# Patient Record
Sex: Male | Born: 1960 | Race: Black or African American | Hispanic: No | Marital: Married | Smoking: Current every day smoker
Health system: Southern US, Community
[De-identification: ages and names within clinical notes are randomized; demographics above are authoritative.]

## PROBLEM LIST (undated history)

## (undated) DIAGNOSIS — I10 Essential (primary) hypertension: Secondary | ICD-10-CM

## (undated) HISTORY — PX: TOTAL HIP ARTHROPLASTY: SHX124

---

## 1999-12-08 ENCOUNTER — Emergency Department (HOSPITAL_COMMUNITY): Admission: EM | Admit: 1999-12-08 | Discharge: 1999-12-08 | Payer: Self-pay | Admitting: Internal Medicine

## 2009-05-11 ENCOUNTER — Inpatient Hospital Stay (HOSPITAL_COMMUNITY): Admission: RE | Admit: 2009-05-11 | Discharge: 2009-05-14 | Payer: Self-pay | Admitting: Orthopaedic Surgery

## 2010-02-25 ENCOUNTER — Emergency Department (HOSPITAL_COMMUNITY): Admission: EM | Admit: 2010-02-25 | Discharge: 2010-02-25 | Payer: Self-pay | Admitting: Emergency Medicine

## 2010-08-26 LAB — PROTIME-INR
INR: 1.06 (ref 0.00–1.49)
INR: 1.6 — ABNORMAL HIGH (ref 0.00–1.49)
INR: 1.83 — ABNORMAL HIGH (ref 0.00–1.49)
Prothrombin Time: 13.7 seconds (ref 11.6–15.2)
Prothrombin Time: 14.4 seconds (ref 11.6–15.2)
Prothrombin Time: 18.9 seconds — ABNORMAL HIGH (ref 11.6–15.2)
Prothrombin Time: 21 seconds — ABNORMAL HIGH (ref 11.6–15.2)

## 2010-08-26 LAB — BASIC METABOLIC PANEL
BUN: 6 mg/dL (ref 6–23)
Calcium: 8.8 mg/dL (ref 8.4–10.5)
Calcium: 8.9 mg/dL (ref 8.4–10.5)
Chloride: 93 mEq/L — ABNORMAL LOW (ref 96–112)
Creatinine, Ser: 0.76 mg/dL (ref 0.4–1.5)
Creatinine, Ser: 0.81 mg/dL (ref 0.4–1.5)
GFR calc Af Amer: >60 mL/min (ref >60)
GFR calc Af Amer: >60 mL/min (ref >60)
GFR calc non Af Amer: >60 mL/min (ref >60)
GFR calc non Af Amer: >60 mL/min (ref >60)
GFR calc non Af Amer: >60 mL/min (ref >60)
Glucose, Bld: 121 mg/dL — ABNORMAL HIGH (ref 70–99)
Sodium: 130 mEq/L — ABNORMAL LOW (ref 135–145)

## 2010-08-26 LAB — CBC
Hemoglobin: 11.8 g/dL — ABNORMAL LOW (ref 13.0–17.0)
MCV: 90.5 fL (ref 78.0–100.0)
Platelets: 260 10*3/uL (ref 150–400)
Platelets: 297 10*3/uL (ref 150–400)
RBC: 3.63 MIL/uL — ABNORMAL LOW (ref 4.22–5.81)
RDW: 13.2 % (ref 11.5–15.5)
WBC: 10.5 10*3/uL (ref 4.0–10.5)
WBC: 14.3 10*3/uL — ABNORMAL HIGH (ref 4.0–10.5)

## 2010-08-26 LAB — TYPE AND SCREEN
ABO/RH(D): O POS
Antibody Screen: NEGATIVE

## 2010-08-27 LAB — BASIC METABOLIC PANEL
Calcium: 9.7 mg/dL (ref 8.4–10.5)
GFR calc Af Amer: >60 mL/min (ref >60)
GFR calc non Af Amer: >60 mL/min (ref >60)
Potassium: 4.4 mEq/L (ref 3.5–5.1)
Sodium: 139 mEq/L (ref 135–145)

## 2011-06-28 IMAGING — CR DG HIP (WITH OR WITHOUT PELVIS) 2-3V*L*
3 series · 3 of 3 positions shown · non-contrast
Comparison: 05/11/2009

CLINICAL DATA: Fell - left hip pain

LEFT HIP - COMPLETE 2+ VIEW

[t pelvis a.p.]
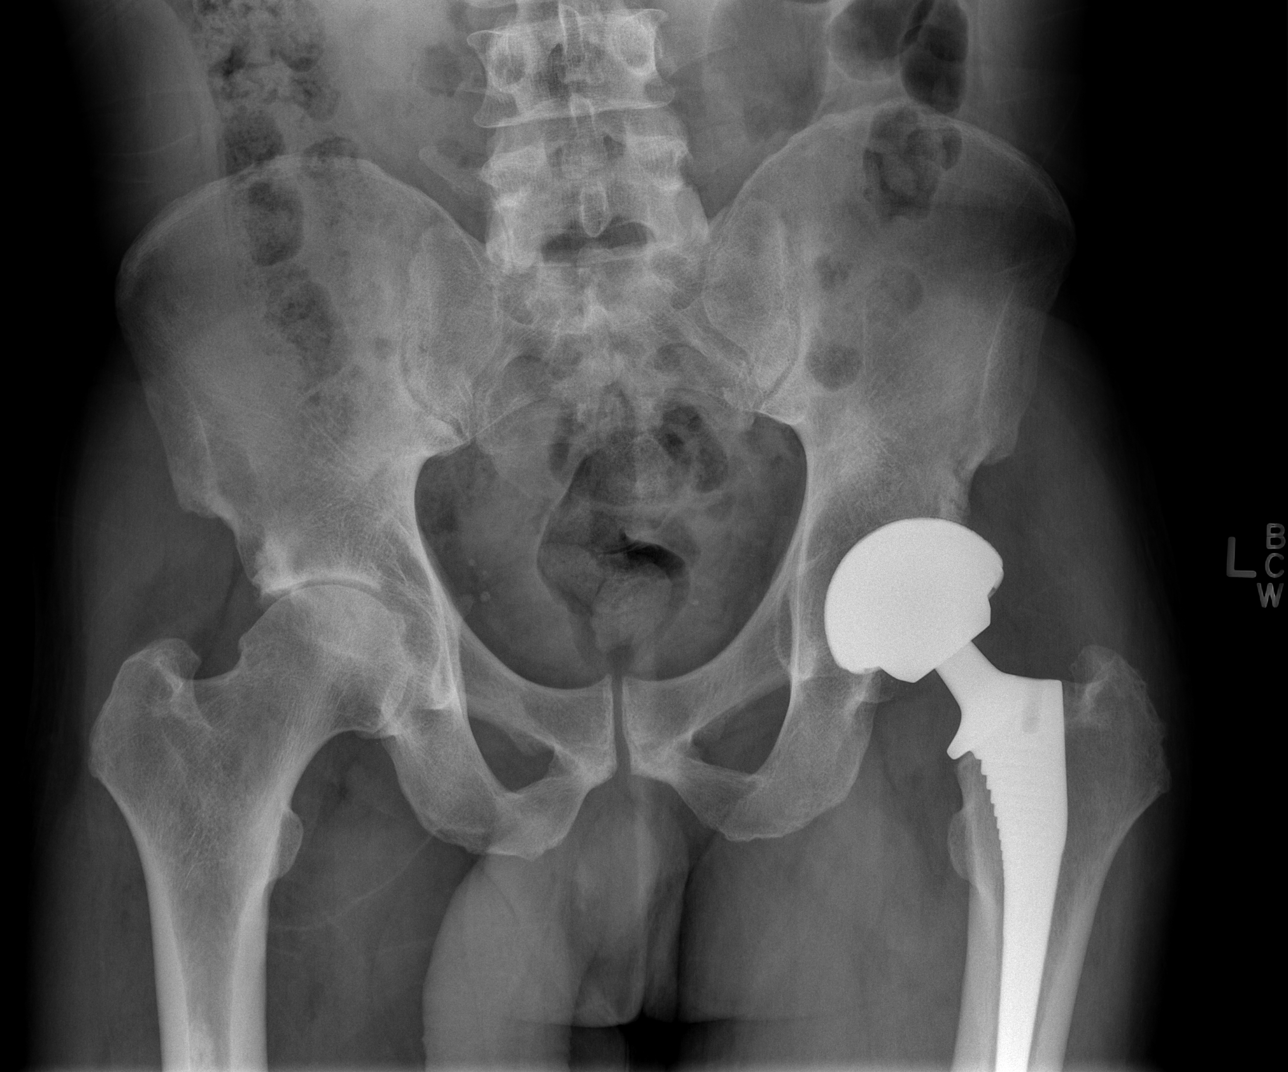

[t hip ap left]
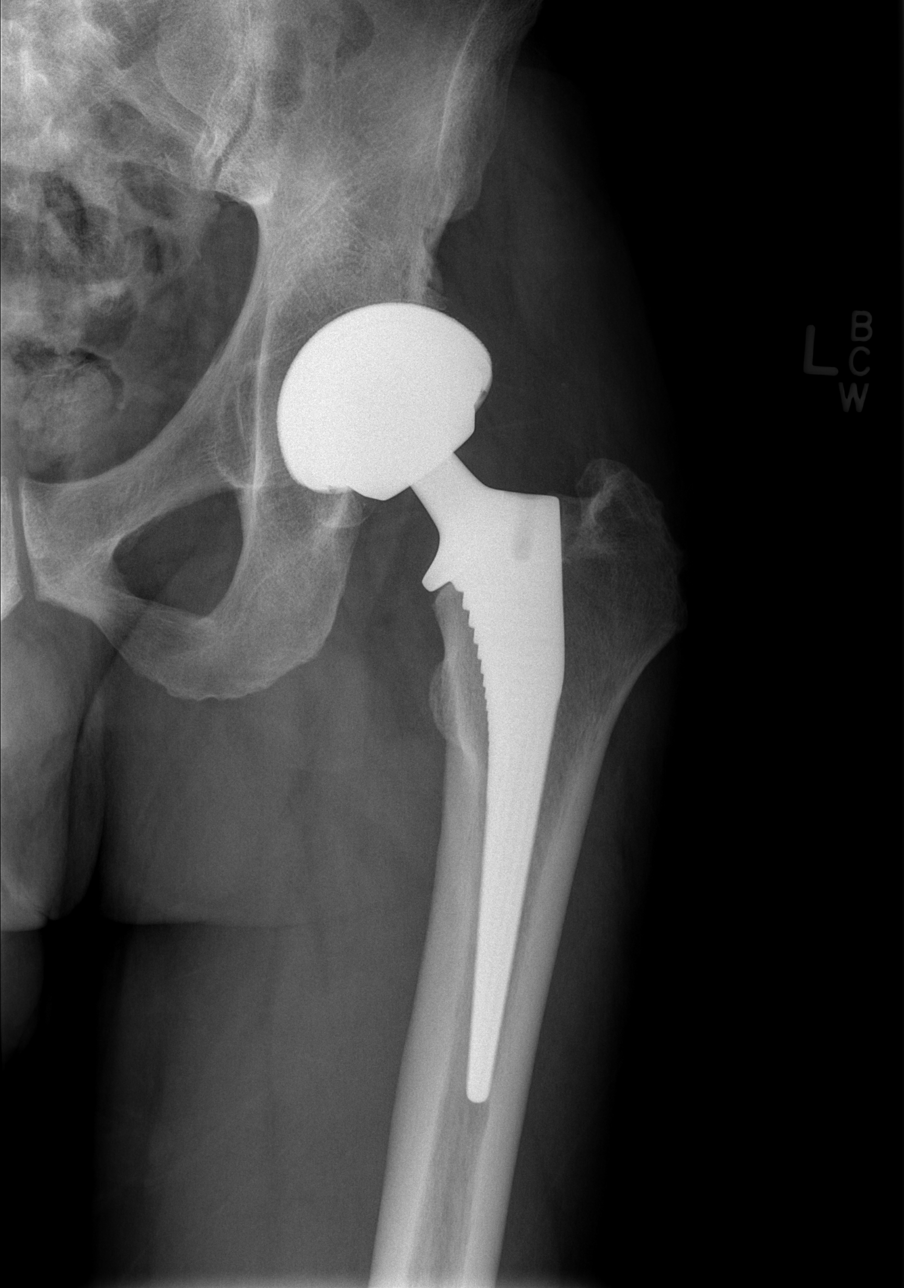

[t hip frog leg left]
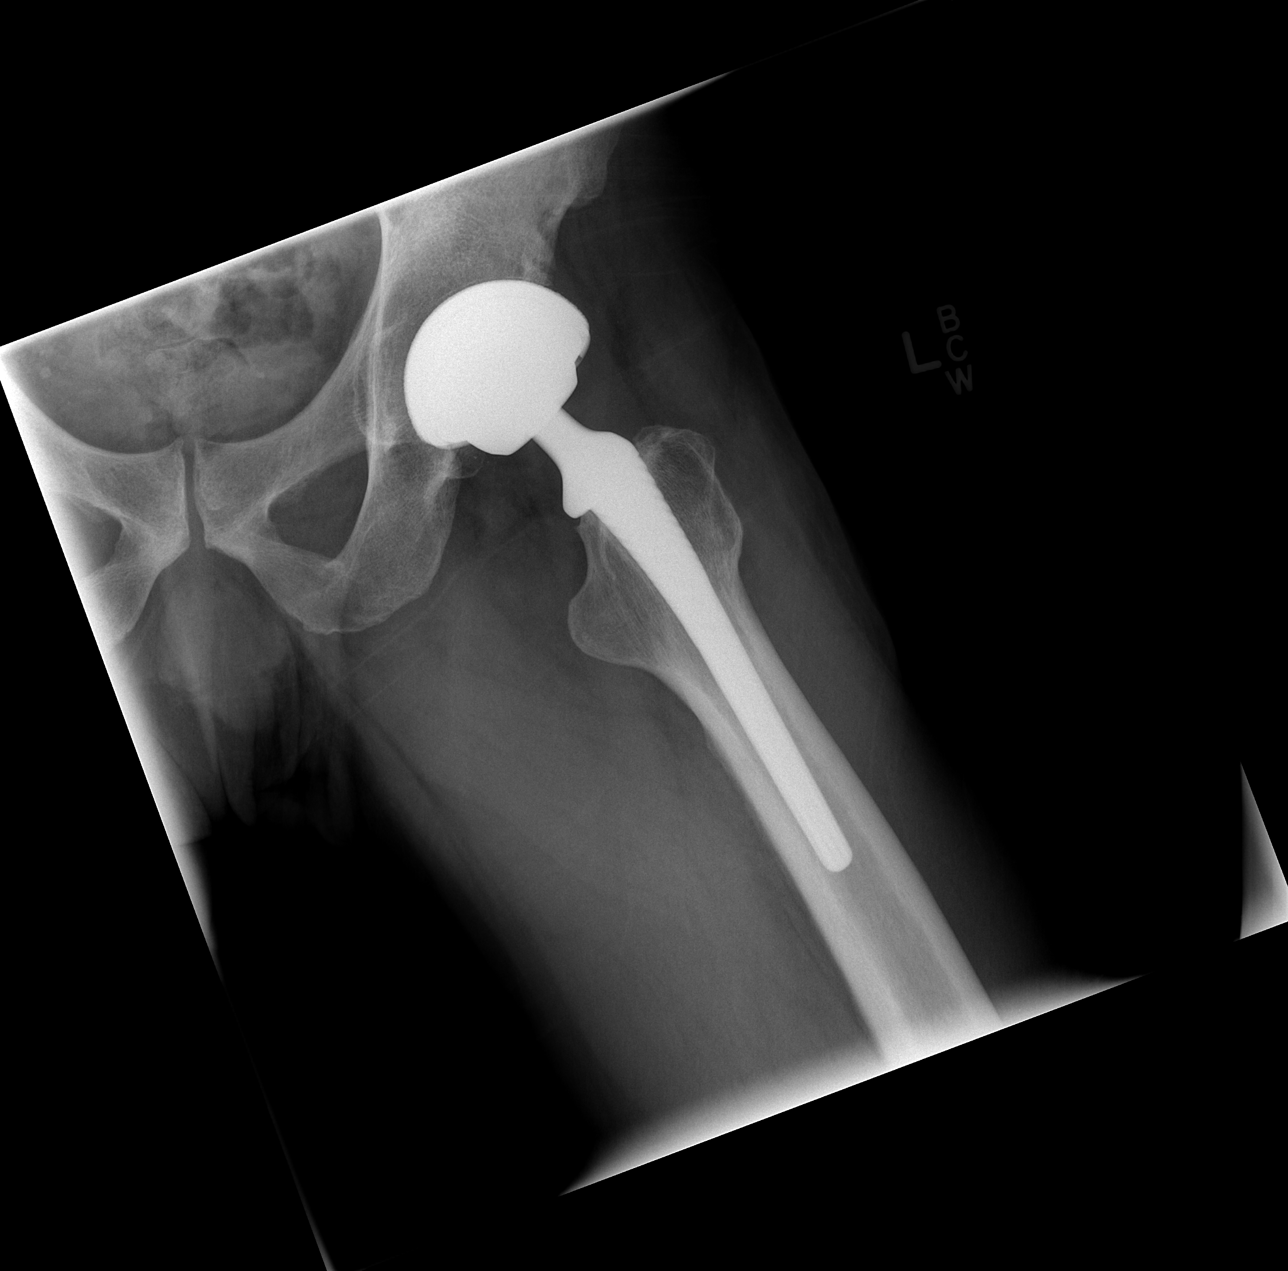

[3 of 3 positions shown; findings below may reference images not displayed]

FINDINGS: Post left hip arthroplasty changes again noted.  No
fracture, dislocation, or other complications.  There are stable
degenerative changes of the right hip.
IMPRESSION: No acute findings.

## 2016-09-01 ENCOUNTER — Encounter (HOSPITAL_COMMUNITY): Payer: Self-pay

## 2016-09-01 ENCOUNTER — Emergency Department (HOSPITAL_COMMUNITY)
Admission: EM | Admit: 2016-09-01 | Discharge: 2016-09-01 | Disposition: A | Discharge status: Home / Self Care | Payer: Medicare Other | Attending: Emergency Medicine | Admitting: Emergency Medicine

## 2016-09-01 ENCOUNTER — Emergency Department (HOSPITAL_COMMUNITY)
Admission: EM | Admit: 2016-09-01 | Discharge: 2016-09-01 | Disposition: A | Discharge status: Home / Self Care | Payer: Medicaid Other | Attending: Dermatology | Admitting: Dermatology

## 2016-09-01 DIAGNOSIS — F17200 Nicotine dependence, unspecified, uncomplicated: Secondary | ICD-10-CM | POA: Insufficient documentation

## 2016-09-01 DIAGNOSIS — Z5321 Procedure and treatment not carried out due to patient leaving prior to being seen by health care provider: Secondary | ICD-10-CM | POA: Insufficient documentation

## 2016-09-01 DIAGNOSIS — I159 Secondary hypertension, unspecified: Secondary | ICD-10-CM | POA: Insufficient documentation

## 2016-09-01 DIAGNOSIS — I10 Essential (primary) hypertension: Secondary | ICD-10-CM | POA: Insufficient documentation

## 2016-09-01 LAB — CBC WITH DIFFERENTIAL/PLATELET
Basophils Absolute: 0 10*3/uL (ref 0.0–0.1)
Basophils Relative: 0 %
Eosinophils Absolute: 0.4 10*3/uL (ref 0.0–0.7)
Eosinophils Relative: 4 %
HEMATOCRIT: 36.9 % — AB (ref 39.0–52.0)
HEMOGLOBIN: 12.4 g/dL — AB (ref 13.0–17.0)
LYMPHS ABS: 2.1 10*3/uL (ref 0.7–4.0)
LYMPHS PCT: 23 %
MCH: 30 pg (ref 26.0–34.0)
MCHC: 33.6 g/dL (ref 30.0–36.0)
MCV: 89.1 fL (ref 78.0–100.0)
MONO ABS: 1 10*3/uL (ref 0.1–1.0)
MONOS PCT: 11 %
NEUTROS ABS: 5.6 10*3/uL (ref 1.7–7.7)
NEUTROS PCT: 62 %
Platelets: 297 10*3/uL (ref 150–400)
RBC: 4.14 MIL/uL — ABNORMAL LOW (ref 4.22–5.81)
RDW: 13.7 % (ref 11.5–15.5)
WBC: 9.1 10*3/uL (ref 4.0–10.5)

## 2016-09-01 LAB — BASIC METABOLIC PANEL
Anion gap: 11 (ref 5–15)
BUN: 10 mg/dL (ref 6–20)
CHLORIDE: 98 mmol/L — AB (ref 101–111)
CO2: 26 mmol/L (ref 22–32)
CREATININE: 1.07 mg/dL (ref 0.61–1.24)
Calcium: 9.3 mg/dL (ref 8.9–10.3)
GFR calc non Af Amer: >60 mL/min (ref >60)
GLUCOSE: 89 mg/dL (ref 65–99)
Potassium: 3.7 mmol/L (ref 3.5–5.1)
Sodium: 135 mmol/L (ref 135–145)

## 2016-09-01 LAB — TSH: TSH: 2.305 u[IU]/mL (ref 0.350–4.500)

## 2016-09-01 MED ORDER — HYDROCHLOROTHIAZIDE 25 MG PO TABS
25.0000 mg | ORAL_TABLET | Freq: Every day | ORAL | Status: DC
  Administered 2016-09-01: 25 mg via ORAL
  Filled 2016-09-01: qty 1

## 2016-09-01 MED ORDER — HYDROCHLOROTHIAZIDE 25 MG PO TABS: 25.0000 mg | ORAL_TABLET | Freq: Every day | ORAL | 1 refills | Status: DC

## 2016-09-01 NOTE — ED Triage Notes (Signed)
Pt was here for same earlier but had to leave to pick up son from school. Pt was at [facility] and had bp checked and was instructed to come here. PT bp was 210/110. Pt denies headache or other symptoms. He reports he is supposed to be seeing pcp to get back on bp meds

## 2016-09-01 NOTE — ED Notes (Signed)
Pt states he needs to leave and go pick up his son. RN advised against leaving and pt understands the risks of leaving. Pt still wishes to leave and come back. Pt takes full responsibility.

## 2016-09-01 NOTE — ED Provider Notes (Signed)
[facility] DEPT Provider Note   CSN: [id number] Arrival date & time: 09/01/16  1529     History   Chief Complaint Chief Complaint  Patient presents with  . Hypertension    HPI Steven Lucas is a 56 y.o. male.  Last med refill was for HCTZ a year ago. None since then. BP high since then. No chest pain, headache, sob, leg swelling, decreased UOP, vision changes or other associated symptoms.    Hypertension  This is a chronic problem. The current episode started more than 1 week ago. The problem occurs constantly. The problem has not changed since onset.Pertinent negatives include no chest pain, no abdominal pain, no headaches and no shortness of breath. Nothing aggravates the symptoms. Nothing relieves the symptoms. He has tried nothing for the symptoms. The treatment provided no relief.    History reviewed. No pertinent past medical history.  There are no active problems to display for this patient.   History reviewed. No pertinent surgical history.     Home Medications    Prior to Admission medications   Medication Sig Start Date End Date Taking? Authorizing Provider  hydrochlorothiazide (HYDRODIURIL) 25 MG tablet Take 1 tablet (25 mg total) by mouth daily. 09/01/16   Marily Memos, MD    Family History No family history on file.  Social History Social History  Substance Use Topics  . Smoking status: Current Every Day Smoker  . Smokeless tobacco: Never Used  . Alcohol use No     Allergies   Patient has no known allergies.   Review of Systems Review of Systems  Respiratory: Negative for shortness of breath.   Cardiovascular: Negative for chest pain.  Gastrointestinal: Negative for abdominal pain.  Neurological: Negative for headaches.  All other systems reviewed and are negative.    Physical Exam Updated Vital Signs BP (!) 175/104   Pulse 94   Temp 98.5 F (36.9 C) (Oral)   Resp 19   Ht  (1.676 m)   Wt 162 lb (73.5 kg)   SpO2 96%   BMI  26.15 kg/m   Physical Exam  Constitutional: He is oriented to person, place, and time. He appears well-developed and well-nourished.  HENT:  Head: Normocephalic and atraumatic.  Eyes: Conjunctivae and EOM are normal.  exopthalmos  Neck: Normal range of motion.  Cardiovascular: Tachycardia present.   Pulmonary/Chest: Effort normal. No respiratory distress.  Abdominal: Soft. He exhibits no distension.  Musculoskeletal: Normal range of motion.  Neurological: He is alert and oriented to person, place, and time. No cranial nerve deficit. Coordination normal.  Skin: Skin is warm and dry.  Nursing note and vitals reviewed.    ED Treatments / Results  Labs (all labs ordered are listed, but only abnormal results are displayed) Labs Reviewed  CBC WITH DIFFERENTIAL/PLATELET - Abnormal; Notable for the following:       Result Value   RBC 4.14 (*)    Hemoglobin 12.4 (*)    HCT 36.9 (*)    All other components within normal limits  BASIC METABOLIC PANEL - Abnormal; Notable for the following:    Chloride 98 (*)    All other components within normal limits  TSH    EKG  EKG Interpretation None       Radiology No results found.  Procedures Procedures (including critical care time)  Medications Ordered in ED Medications  hydrochlorothiazide (HYDRODIURIL) tablet 25 mg (25 mg Oral Given 09/01/16 2130)     Initial Impression / Assessment and  Plan / ED Course  I have reviewed the triage vital signs and the nursing notes.  Pertinent labs & imaging results that were available during my care of the patient were reviewed by me and considered in my medical decision making (see chart for details).    Tachycardia likely 2/2 caffeine intake and general increased energy but will check thyroid and basic labs. If ok, restart HCTZ, pcp follow up for same. Workup unremarkable, patient is stable for discharge and follow-up with his primary doctor on Thursday as previously scheduled.  Final  Clinical Impressions(s) / ED Diagnoses   Final diagnoses:  Secondary hypertension    New Prescriptions Discharge Medication List as of 09/01/2016 11:15 PM    START taking these medications   Details  hydrochlorothiazide (HYDRODIURIL) 25 MG tablet Take 1 tablet (25 mg total) by mouth daily., Starting Mon 09/01/2016, Print         Marily Memos, MD 09/01/16 [phone]

## 2016-11-20 ENCOUNTER — Emergency Department (HOSPITAL_COMMUNITY): Payer: Medicare Other

## 2016-11-20 ENCOUNTER — Encounter (HOSPITAL_COMMUNITY): Payer: Self-pay

## 2016-11-20 ENCOUNTER — Observation Stay (HOSPITAL_COMMUNITY)
Admission: EM | Admit: 2016-11-20 | Discharge: 2016-11-21 | Disposition: A | Discharge status: Home / Self Care | Payer: Medicare Other | Attending: Internal Medicine | Admitting: Internal Medicine

## 2016-11-20 DIAGNOSIS — Z79899 Other long term (current) drug therapy: Secondary | ICD-10-CM | POA: Diagnosis not present

## 2016-11-20 DIAGNOSIS — F1410 Cocaine abuse, uncomplicated: Secondary | ICD-10-CM | POA: Diagnosis present

## 2016-11-20 DIAGNOSIS — F17200 Nicotine dependence, unspecified, uncomplicated: Secondary | ICD-10-CM | POA: Diagnosis not present

## 2016-11-20 DIAGNOSIS — I119 Hypertensive heart disease without heart failure: Secondary | ICD-10-CM | POA: Insufficient documentation

## 2016-11-20 DIAGNOSIS — R079 Chest pain, unspecified: Principal | ICD-10-CM | POA: Diagnosis present

## 2016-11-20 DIAGNOSIS — Z9114 Patient's other noncompliance with medication regimen: Secondary | ICD-10-CM | POA: Insufficient documentation

## 2016-11-20 DIAGNOSIS — I10 Essential (primary) hypertension: Secondary | ICD-10-CM | POA: Diagnosis present

## 2016-11-20 DIAGNOSIS — F1010 Alcohol abuse, uncomplicated: Secondary | ICD-10-CM | POA: Diagnosis not present

## 2016-11-20 HISTORY — DX: Essential (primary) hypertension: I10

## 2016-11-20 LAB — CBC
HCT: 39.8 % (ref 39.0–52.0)
HEMOGLOBIN: 13.5 g/dL (ref 13.0–17.0)
MCH: 30.8 pg (ref 26.0–34.0)
MCHC: 33.9 g/dL (ref 30.0–36.0)
MCV: 90.7 fL (ref 78.0–100.0)
PLATELETS: 290 10*3/uL (ref 150–400)
RBC: 4.39 MIL/uL (ref 4.22–5.81)
RDW: 13.7 % (ref 11.5–15.5)
WBC: 10.2 10*3/uL (ref 4.0–10.5)

## 2016-11-20 LAB — COMPREHENSIVE METABOLIC PANEL
ALT: 19 U/L (ref 17–63)
AST: 30 U/L (ref 15–41)
Albumin: 3.9 g/dL (ref 3.5–5.0)
Alkaline Phosphatase: 78 U/L (ref 38–126)
Anion gap: 8 (ref 5–15)
BILIRUBIN TOTAL: 0.7 mg/dL (ref 0.3–1.2)
BUN: 11 mg/dL (ref 6–20)
CO2: 26 mmol/L (ref 22–32)
Calcium: 9.2 mg/dL (ref 8.9–10.3)
Chloride: 103 mmol/L (ref 101–111)
Creatinine, Ser: 1.1 mg/dL (ref 0.61–1.24)
Glucose, Bld: 158 mg/dL — ABNORMAL HIGH (ref 65–99)
POTASSIUM: 3.9 mmol/L (ref 3.5–5.1)
Sodium: 137 mmol/L (ref 135–145)
TOTAL PROTEIN: 7 g/dL (ref 6.5–8.1)

## 2016-11-20 LAB — I-STAT TROPONIN, ED
TROPONIN I, POC: 0.02 ng/mL (ref 0.00–0.08)
TROPONIN I, POC: 0.02 ng/mL (ref 0.00–0.08)
TROPONIN I, POC: 0.01 ng/mL (ref 0.00–0.08)

## 2016-11-20 LAB — RAPID URINE DRUG SCREEN, HOSP PERFORMED
Amphetamines: NOT DETECTED
BENZODIAZEPINES: NOT DETECTED
Barbiturates: NOT DETECTED
Cocaine: POSITIVE — AB
Opiates: NOT DETECTED
Tetrahydrocannabinol: NOT DETECTED

## 2016-11-20 LAB — ETHANOL

## 2016-11-20 LAB — MAGNESIUM: MAGNESIUM: 2.2 mg/dL (ref 1.7–2.4)

## 2016-11-20 MED ORDER — THIAMINE HCL 100 MG/ML IJ SOLN: 100.0000 mg | Freq: Every day | INTRAMUSCULAR | Status: DC

## 2016-11-20 MED ORDER — GUAIFENESIN-DM 100-10 MG/5ML PO SYRP: 5.0000 mL | ORAL_SOLUTION | ORAL | Status: DC | PRN

## 2016-11-20 MED ORDER — LORAZEPAM 2 MG/ML IJ SOLN
1.0000 mg | Freq: Once | INTRAMUSCULAR | Status: AC
  Administered 2016-11-20: 1 mg via INTRAVENOUS
  Filled 2016-11-20: qty 1

## 2016-11-20 MED ORDER — LORAZEPAM 1 MG PO TABS: 0.0000 mg | ORAL_TABLET | Freq: Two times a day (BID) | ORAL | Status: DC

## 2016-11-20 MED ORDER — VITAMIN B-1 100 MG PO TABS
100.0000 mg | ORAL_TABLET | Freq: Every day | ORAL | Status: DC
  Administered 2016-11-20 – 2016-11-21 (×2): 100 mg via ORAL
  Filled 2016-11-20 (×2): qty 1

## 2016-11-20 MED ORDER — ACETAMINOPHEN 325 MG PO TABS
650.0000 mg | ORAL_TABLET | ORAL | Status: DC | PRN
  Administered 2016-11-21: 650 mg via ORAL
  Filled 2016-11-20: qty 2

## 2016-11-20 MED ORDER — GUAIFENESIN-DM 100-10 MG/5ML PO SYRP
5.0000 mL | ORAL_SOLUTION | Freq: Once | ORAL | Status: AC
  Administered 2016-11-20: 5 mL via ORAL
  Filled 2016-11-20: qty 5

## 2016-11-20 MED ORDER — GUAIFENESIN-DM 100-10 MG/5ML PO SYRP: 5.0000 mL | ORAL_SOLUTION | Freq: Once | ORAL | Status: DC

## 2016-11-20 MED ORDER — ONDANSETRON HCL 4 MG/2ML IJ SOLN: 4.0000 mg | Freq: Four times a day (QID) | INTRAMUSCULAR | Status: DC | PRN

## 2016-11-20 MED ORDER — LORAZEPAM 1 MG PO TABS: 0.0000 mg | ORAL_TABLET | Freq: Four times a day (QID) | ORAL | Status: DC

## 2016-11-20 MED ORDER — LORAZEPAM 2 MG/ML IJ SOLN
0.0000 mg | Freq: Four times a day (QID) | INTRAMUSCULAR | Status: DC
Start: 2016-11-20 — End: 2016-11-21

## 2016-11-20 MED ORDER — LORAZEPAM 2 MG/ML IJ SOLN
0.0000 mg | Freq: Two times a day (BID) | INTRAMUSCULAR | Status: DC
Start: 2016-11-23 — End: 2016-11-21

## 2016-11-20 MED ORDER — ENOXAPARIN SODIUM 40 MG/0.4ML ~~LOC~~ SOLN: 40.0000 mg | SUBCUTANEOUS | Status: DC

## 2016-11-20 NOTE — H&P (Signed)
History and Physical    Steven Lucas ZOX:[id number] DOB: 05/03/1961 DOA: 11/20/2016  PCP: Patient, No Pcp Per  Patient coming from: Home  I have personally briefly reviewed patient[facility]  Chief Complaint: EtOH abuse  HPI: Steven MerlinJoey M Forrester is a 56 y.o. male with medical history significant of HTN.  He presents to ED with request for detox.  He uses EtOH and crack cocaine daily, last use last night.  Last night he developed sharp chest pain in center of chest, no radiation.  Associated with heavy breathing.  Was unremitting and constant since onset until he was given ativan in the ED.   ED Course: After being given just 1mg  ativan he is now pain free, and resting very comfortably.  Trop is 0.02 x2 thus far.  EKG with non-specific abnormality.  EDP is concerned patient may need inpatient rule out for CP.  Review of Systems: As per HPI otherwise 10 point review of systems negative.   Past Medical History:  Diagnosis Date  . Hypertension     Past Surgical History:  Procedure Laterality Date  . TOTAL HIP ARTHROPLASTY       reports that he has been smoking.  He has never used smokeless tobacco. He reports that he drinks alcohol. He reports that he uses drugs, including Cocaine.  No Known Allergies  No family history on file.   Prior to Admission medications   Medication Sig Start Date End Date Taking? Authorizing Provider  hydrochlorothiazide (HYDRODIURIL) 25 MG tablet Take 1 tablet (25 mg total) by mouth daily. 09/01/16  Yes Mesner, Barbara CowerJason, MD    Physical Exam: Vitals:   11/20/16 1645 11/20/16 1715 11/20/16 1800 11/20/16 1845  BP: (!) 177/97     Pulse: 92 95 91 90  Resp: 16 19 19 18   Temp:      TempSrc:      SpO2: 97% 100% 96% 95%  Weight:      Height:        Constitutional: NAD, calm, comfortable, sedated Eyes: PERRL, lids and conjunctivae normal ENMT: Mucous membranes are moist. Posterior pharynx clear of any exudate or  lesions.Normal dentition.  Neck: normal, supple, no masses, no thyromegaly Respiratory: clear to auscultation bilaterally, no wheezing, no crackles. Normal respiratory effort. No accessory muscle use.  Cardiovascular: Regular rate and rhythm, no murmurs / rubs / gallops. No extremity edema. 2+ pedal pulses. No carotid bruits.  Abdomen: no tenderness, no masses palpated. No hepatosplenomegaly. Bowel sounds positive.  Musculoskeletal: no clubbing / cyanosis. No joint deformity upper and lower extremities. Good ROM, no contractures. Normal muscle tone.  Skin: no rashes, lesions, ulcers. No induration Neurologic: CN 2-12 grossly intact. Sensation intact, DTR normal. Strength 5/5 in all 4.  Psychiatric: Normal judgment and insight. Alert and oriented x 3. Normal mood.    Labs on Admission: I have personally reviewed following labs and imaging studies  CBC:  Recent Labs Lab 11/20/16 1415  WBC 10.2  HGB 13.5  HCT 39.8  MCV 90.7  PLT 290   Basic Metabolic Panel:  Recent Labs Lab 11/20/16 1415  NA 137  K 3.9  CL 103  CO2 26  GLUCOSE 158*  BUN 11  CREATININE 1.10  CALCIUM 9.2  MG 2.2   GFR: Estimated Creatinine Clearance: 66.9 mL/min (by C-G formula based on SCr of 1.1 mg/dL). Liver Function Tests:  Recent Labs Lab 11/20/16 1415  AST 30  ALT 19  ALKPHOS 78  BILITOT 0.7  PROT 7.0  ALBUMIN 3.9   No results for input(s): LIPASE, AMYLASE in the last 168 hours. No results for input(s): AMMONIA in the last 168 hours. Coagulation Profile: No results for input(s): INR, PROTIME in the last 168 hours. Cardiac Enzymes: No results for input(s): CKTOTAL, CKMB, CKMBINDEX, TROPONINI in the last 168 hours. BNP (last 3 results) No results for input(s): PROBNP in the last 8760 hours. HbA1C: No results for input(s): HGBA1C in the last 72 hours. CBG: No results for input(s): GLUCAP in the last 168 hours. Lipid Profile: No results for input(s): CHOL, HDL, LDLCALC, TRIG, CHOLHDL,  LDLDIRECT in the last 72 hours. Thyroid Function Tests: No results for input(s): TSH, T4TOTAL, FREET4, T3FREE, THYROIDAB in the last 72 hours. Anemia Panel: No results for input(s): VITAMINB12, FOLATE, FERRITIN, TIBC, IRON, RETICCTPCT in the last 72 hours. Urine analysis: No results found for: COLORURINE, APPEARANCEUR, LABSPEC, PHURINE, GLUCOSEU, HGBUR, BILIRUBINUR, KETONESUR, PROTEINUR, UROBILINOGEN, NITRITE, LEUKOCYTESUR  Radiological Exams on Admission: Dg Chest 2 View  Result Date: 11/20/2016 CLINICAL DATA:  Mid chest pain with vomiting today. The patient wishes to D toxic thigh for alcohol and crack cocaine which he use last night. History of hypertension but not currently on medication. Current smoker. EXAM: CHEST  2 VIEW COMPARISON:  PA and lateral chest x-ray of May 09, 2009 FINDINGS: The lungs are well-expanded and clear. The heart and pulmonary vascularity are normal. The mediastinum is normal in width. There is no pleural effusion. The bony thorax is unremarkable. IMPRESSION: There is no active cardiopulmonary disease. Electronically Signed   By: David  [place] M.D.   On: 11/20/2016 14:35    EKG: Independently reviewed.  Assessment/Plan Principal Problem:   Chest pain Active Problems:   ETOH abuse   Cocaine abuse    1. Chest pain - 1. 2 neg trops, has already been observed for 7 hours now in ED, has been nearly 24 hours since last cocaine use.  CP free at this time. 2. EDP calling cards to see if they think patient will need inpatient provocative testing or not. 3. If he does then we will admit and keep NPO after midnight for them to eval in AM for stress test. 4. If he doesn't then likely okay to be medically cleared for detox at this point. 2. EtOH abuse - 1. Very mild withdrawal requirements at this point, patient is sedated and cant remain awake for a conversation after just 1mg  ativan in ED. 2. Likely can be done as outpatient / detox facility. 3. Cocaine abuse -  needs to stop cocaine  DVT prophylaxis: Lovenox Code Status: Full Family Communication: No family in room Disposition Plan: Home after admit Consults called: Cards by EDP Admission status: Place in obs   Nilesh Stegall M. DO [facility] Pager [phone]  If 7AM-7PM, please contact day team taking care of patient [email or web address] Password [email or web address]  11/20/2016, 8:22 PM

## 2016-11-20 NOTE — ED Provider Notes (Signed)
[facility] DEPT Provider Note   CSN: [id number] Arrival date & time: 11/20/16  1327     History   Chief Complaint Chief Complaint  Patient presents with  . Alcohol Problem    HPI Steven Lucas is a 56 y.o. male.  HPI 56 year old male who presents with chest pain and request for detox. He has a history of alcohol dependence and chronic crack cocaine usage. States that he drinks and uses crack cocaine daily, with last drink and last cocaine usage yesterday evening. Begin to develop sharp chest pains in the center of his chest that was nonradiating associated with heavy breathing last night that has been unremitting. 2 episodes of nausea and vomiting this morning. Denies any tremor in or agitation. Denies any hallucinations. No abdominal pain, fevers or chills, syncope or near syncope. Normally drinks about 8 x 40s daily.    Past Medical History:  Diagnosis Date  . Hypertension     Patient Active Problem List   Diagnosis Date Noted  . Chest pain 11/20/2016  . ETOH abuse 11/20/2016  . Cocaine abuse 11/20/2016    Past Surgical History:  Procedure Laterality Date  . TOTAL HIP ARTHROPLASTY         Home Medications    Prior to Admission medications   Medication Sig Start Date End Date Taking? Authorizing Provider  hydrochlorothiazide (HYDRODIURIL) 25 MG tablet Take 1 tablet (25 mg total) by mouth daily. 09/01/16  Yes Mesner, Barbara Cower, MD    Family History No family history on file.  Social History Social History  Substance Use Topics  . Smoking status: Current Every Day Smoker  . Smokeless tobacco: Never Used  . Alcohol use Yes     Comment: "Over a 12 pack"/day     Allergies   Patient has no known allergies.   Review of Systems Review of Systems  Constitutional: Negative for fever.  Respiratory: Positive for shortness of breath.   Cardiovascular: Positive for chest pain. Negative for leg swelling.  Gastrointestinal: Negative for abdominal pain.    Allergic/Immunologic: Negative for immunocompromised state.  Hematological: Does not bruise/bleed easily.  Psychiatric/Behavioral: Negative for hallucinations.  All other systems reviewed and are negative.    Physical Exam Updated Vital Signs BP (!) 177/97   Pulse 90   Temp 98.2 F (36.8 C) (Oral)   Resp 18   Ht 5\' 6"  (1.676 m)   Wt 72.6 kg (160 lb)   SpO2 95%   BMI 25.82 kg/m   Physical Exam Physical Exam  Nursing note and vitals reviewed. Constitutional: Well developed, well nourished, non-toxic, and in no acute distress Head: Normocephalic and atraumatic.  Mouth/Throat: Oropharynx is clear and moist.  Neck: Normal range of motion. Neck supple.  Cardiovascular: Tachycardic rate and regular rhythm.   Pulmonary/Chest: Effort normal and breath sounds normal.  Abdominal: Soft. There is no tenderness. There is no rebound and no guarding.  Musculoskeletal: Normal range of motion. No edema. Neurological: Alert, no facial droop, fluent speech, moves all extremities symmetrically Skin: Skin is warm and dry.  Psychiatric: Cooperative   ED Treatments / Results  Labs (all labs ordered are listed, but only abnormal results are displayed) Labs Reviewed  COMPREHENSIVE METABOLIC PANEL - Abnormal; Notable for the following:       Result Value   Glucose, Bld 158 (*)    All other components within normal limits  RAPID URINE DRUG SCREEN, HOSP PERFORMED - Abnormal; Notable for the following:    Cocaine POSITIVE (*)  All other components within normal limits  CBC  ETHANOL  MAGNESIUM  I-STAT TROPOININ, ED  I-STAT TROPOININ, ED  I-STAT TROPOININ, ED    EKG  EKG Interpretation  Date/Time:  Thursday November 20 2016 13:31:54 EDT Ventricular Rate:  95 PR Interval:  158 QRS Duration: 68 QT Interval:  352 QTC Calculation: 442 R Axis:   17 Text Interpretation:  Normal sinus rhythm Right atrial enlargement Possible Anterior infarct , age undetermined Abnormal ECG Confirmed by Crista CurbLiu,  Shayonna Ocampo [phone]([id number]) on 11/20/2016 4:24:26 PM       Radiology Dg Chest 2 View  Result Date: 11/20/2016 CLINICAL DATA:  Mid chest pain with vomiting today. The patient wishes to D toxic thigh for alcohol and crack cocaine which he use last night. History of hypertension but not currently on medication. Current smoker. EXAM: CHEST  2 VIEW COMPARISON:  PA and lateral chest x-ray of May 09, 2009 FINDINGS: The lungs are well-expanded and clear. The heart and pulmonary vascularity are normal. The mediastinum is normal in width. There is no pleural effusion. The bony thorax is unremarkable. IMPRESSION: There is no active cardiopulmonary disease. Electronically Signed   By: David  SwazilandJordan M.D.   On: 11/20/2016 14:35    Procedures Procedures (including critical care time)  Medications Ordered in ED Medications  LORazepam (ATIVAN) injection 0-4 mg (0 mg Intravenous Not Given 11/20/16 1659)    Or  LORazepam (ATIVAN) tablet 0-4 mg ( Oral See Alternative 11/20/16 1659)  LORazepam (ATIVAN) injection 0-4 mg (not administered)    Or  LORazepam (ATIVAN) tablet 0-4 mg (not administered)  thiamine (VITAMIN B-1) tablet 100 mg (100 mg Oral Given 11/20/16 1711)    Or  thiamine (B-1) injection 100 mg ( Intravenous See Alternative 11/20/16 1711)  LORazepam (ATIVAN) injection 1 mg (1 mg Intravenous Given 11/20/16 1711)     Initial Impression / Assessment and Plan / ED Course  I have reviewed the triage vital signs and the nursing notes.  Pertinent labs & imaging results that were available during my care of the patient were reviewed by me and considered in my medical decision making (see chart for details).    56 year old male with history of alcohol and cocaine abuse who presents with chest pain. Chest pain has been constant since yesterday evening. He is very hypertensive with systolic blood pressures in the 170s to 180s. Suspect that symptoms likely from cocaine abuse, last use was yesterday. His EKG is not  acutely ischemic and serial troponins are normal. Chest x-ray visualized and shows no infiltrate, widening of mediastinum, or any other acute cardiopulmonary processes. With Ativan, he is more somnolent, and chest pain resolved. He has no significant symptoms of alcohol withdrawal. Significant CAD risk factor with his cocaine use, and overall heart score of 4. Will plan to admit for cardiac rule out.  Final Clinical Impressions(s) / ED Diagnoses   Final diagnoses:  Cocaine abuse  Alcohol abuse  Nonspecific chest pain    New Prescriptions New Prescriptions   No medications on file     Lavera GuiseLiu, Jazia Faraci Duo, MD 11/20/16 2054

## 2016-11-20 NOTE — ED Triage Notes (Signed)
Per Pt, Pt is coming from home with complaints of chest pain and requesting help to detox. Pt reports drinking "more than a 12 pack" per day and using crack cocaine. Last use and drink was last night. Pt reports mid-center chest pain at this time.

## 2016-11-20 NOTE — ED Notes (Signed)
Patient updated on wait times and delays and POC

## 2016-11-21 ENCOUNTER — Encounter (HOSPITAL_COMMUNITY): Payer: Self-pay | Admitting: Cardiology

## 2016-11-21 ENCOUNTER — Observation Stay (HOSPITAL_BASED_OUTPATIENT_CLINIC_OR_DEPARTMENT_OTHER): Payer: Medicare Other

## 2016-11-21 DIAGNOSIS — I10 Essential (primary) hypertension: Secondary | ICD-10-CM

## 2016-11-21 DIAGNOSIS — I5030 Unspecified diastolic (congestive) heart failure: Secondary | ICD-10-CM | POA: Diagnosis not present

## 2016-11-21 DIAGNOSIS — R079 Chest pain, unspecified: Secondary | ICD-10-CM | POA: Diagnosis not present

## 2016-11-21 DIAGNOSIS — F1410 Cocaine abuse, uncomplicated: Secondary | ICD-10-CM | POA: Diagnosis not present

## 2016-11-21 DIAGNOSIS — F1010 Alcohol abuse, uncomplicated: Secondary | ICD-10-CM | POA: Diagnosis not present

## 2016-11-21 LAB — TROPONIN I

## 2016-11-21 LAB — HIV ANTIBODY (ROUTINE TESTING W REFLEX): HIV Screen 4th Generation wRfx: NONREACTIVE

## 2016-11-21 MED ORDER — HYDROCHLOROTHIAZIDE 25 MG PO TABS: 25.0000 mg | ORAL_TABLET | Freq: Every day | ORAL | 1 refills | Status: AC

## 2016-11-21 MED ORDER — PNEUMOCOCCAL VAC POLYVALENT 25 MCG/0.5ML IJ INJ: 0.5000 mL | INJECTION | INTRAMUSCULAR | Status: DC

## 2016-11-21 NOTE — Clinical Social Work Note (Signed)
Clinical Social Work Assessment  Patient Details  Name: Steven Lucas MRN: [id number] Date of Birth: 1961-03-21  Date of referral:  11/21/16               Reason for consult:  Substance Use/ETOH Abuse                Permission sought to share information with:  Family Supports Permission granted to share information::  Yes, Verbal Permission Granted  Name::        Agency::     Relationship::  Wife  Contact Information:     Housing/Transportation Living arrangements for the past 2 months:  Single Family Home Source of Information:  Patient, Medical Team, Spouse Patient Interpreter Needed:  None Criminal Activity/Legal Involvement Pertinent to Current Situation/Hospitalization:  No - Comment as needed Significant Relationships:  Spouse, Adult Children Lives with:  Adult Children, Spouse Do you feel safe going back to the place where you live?  Yes Need for family participation in patient care:  Yes (Comment)  Care giving concerns:  Patient requesting substance abuse resources.   Social Worker assessment / plan:  CSW met with patient. No supports at bedside. CSW introduced role and inquired about interest in substance abuse resources. Patient agreeable. He is most interested in residential treatment facilities in [facility] or [place]. He states that he cannot stay in [place] and be around the same friends that do drugs, drink, etc. CSW provided list of both outpatient and residential substance abuse treatment facilities. Patient understands that we cannot wait for placement at a facility for him to discharge to. Patient was unaware of discharge plans for today so he is unsure where he will stay tonight. Patient reports that when he started doing drugs and drinking again, his wife and son left him. Patient reports using crack cocaine. Patient is hopeful to get treatment and get his family back. CSW offered shelter list but patient declined, stating that shelters had the same types of  people he was trying to get away from. Patient agreeable to bus pass. When CSW came back into the room to provide bus pass, he was on the phone with his wife and she asked to speak with CSW. Patient's wife adamant that the patient needs residential treatment and wants CSW to assist with this. CSW explained to her that the resources had been given and if patient finds a facility he is interested in before he leaves, CSW would be happy to assist with referral. She understands that we cannot hold him in the hospital to find a facility. CSW gave patient CSW contact card and told him to call if he decides on a facility. Patient expressed understanding. No further concerns.   Employment status:  Unemployed Forensic scientist:  Medicare PT Recommendations:  Not assessed at this time Information / Referral to community resources:  Residential Substance Abuse Treatment Options, Outpatient Substance Abuse Treatment Options  Patient/Family's Response to care:  Patient agreeable to receiving substance abuse resources. Patient's wife supportive and involved in patient's care. Patient and his wife appreciated social work intervention.  Patient/Family's Understanding of and Emotional Response to Diagnosis, Current Treatment, and Prognosis:  Patient has a good understanding of the reason for admission and his need for substance abuse treatment. Patient appears happy with hospital care.  Emotional Assessment Appearance:  Appears stated age Attitude/Demeanor/Rapport:  Other (Pleasant) Affect (typically observed):  Accepting, Appropriate, Calm, Pleasant Orientation:  Oriented to Self, Oriented to Place, Oriented to  Time, Oriented  to Situation Alcohol / Substance use:  Alcohol Use, Illicit Drugs Psych involvement (Current and /or in the community):  No (Comment)  Discharge Needs  Concerns to be addressed:  Substance Abuse Concerns, Homelessness Readmission within the last 30 days:  No Current discharge risk:   Substance Abuse Barriers to Discharge:  No Barriers Identified   Steven Chroman, LCSW 11/21/2016, 4:09 PM

## 2016-11-21 NOTE — Progress Notes (Signed)
Pt refused pneumonia vaccine, pt provided a printout of his facesheet for medicaid/medicare information.

## 2016-11-21 NOTE — Progress Notes (Signed)
New Admission Note:   Arrival Method: Bed/ ED  Mental Orientation: Alert and oriented x 4  Telemetry:3e box 10  Assessment: Completed Skin: Intact Pain:0/10 Tubes: None Safety Measures: Safety Fall Prevention Plan has been discussed  Admission:  [facility] Orientation: Patient has been orientated to the room, unit and staff.  Family: none at bedside  Orders to be reviewed and implemented. Will continue to monitor the patient. Call light has been placed within reach and bed alarm has been activated.   Bettey MareJasmina Kathrynne Kulinski, RN Phone: [phone]

## 2016-11-21 NOTE — Consult Note (Signed)
Patient ID: ROLIN SCHULT MRN: [id number], DOB/AGE: 09/08/1960   Admit date: 11/20/2016  Reason for Consult: Chest Pain Requesting Physician: Dr. Gonzella Lex, Internal Medicine    Primary Physician: Patient, No Pcp Per Primary Cardiologist: New    Pt. Profile:  MARTYN TIMME is a 56 y.o. male with a h/o HTN and polysubstance use w/ cocaine, ETOH and tobacco dependence, who presented to the ED 11/20/16 requesting help with detox and also complained of chest pain. Cardiology consultation requested by Dr. Gonzella Lex, Internal Medicine, for chest pain evaluation.    Problem List  Past Medical History:  Diagnosis Date  . Hypertension     Past Surgical History:  Procedure Laterality Date  . TOTAL HIP ARTHROPLASTY       Allergies  No Known Allergies  HPI  Christo ROYAL VANDEVOORT is a 56 y.o. male with a h/o HTN and polysubstance use w/ cocaine, ETOH and tobacco dependence, who presented to the ED 11/20/16 requesting help with detox and also complained of chest pain. Cardiology consultation requested by Dr. Gonzella Lex, Internal Medicine, for chest pain evaluation.   He is a poor historian. He reports development of chest pain yesterday. Substernal and sharp stabbing pain. Last seconds at a time. No pressure/tightness. He has associated dyspnea when he gets CP but this resolves once the chest pain is gone. No n/v or diaphoresis.  In the ED, he was hypertensive with SBPs in the 170s-180s. EKG w/o acute ischemic abnormalities. CXR unremarkable. UDS + for cocaine. Serial troponin's have been negative.   He is currently CP free. His last episode of CP was ~1 hr ago, described above. He acknowledges that he has a bad drug problem and is seeking help. He wants to check into a detox facility for assistance. He denies any family h/o CAD. No personal history of DM or HLD, that he is aware of. He notes he takes HCTZ at home for HTN.   Home Medications  Prior to Admission medications   Medication Sig Start Date  End Date Taking? Authorizing Provider  hydrochlorothiazide (HYDRODIURIL) 25 MG tablet Take 1 tablet (25 mg total) by mouth daily. 09/01/16  Yes Mesner, Barbara Cower, MD    Hospital Medications  . enoxaparin (LOVENOX) injection  40 mg Subcutaneous Q24H  . LORazepam  0-4 mg Intravenous Q6H   Or  . LORazepam  0-4 mg Oral Q6H  . [START ON 11/23/2016] LORazepam  0-4 mg Intravenous Q12H   Or  . [START ON 11/23/2016] LORazepam  0-4 mg Oral Q12H  . [START ON 11/22/2016] pneumococcal 23 valent vaccine  0.5 mL Intramuscular Tomorrow-1000  . thiamine  100 mg Oral Daily   Or  . thiamine  100 mg Intravenous Daily    acetaminophen, ondansetron (ZOFRAN) IV  Family History  Family History  Problem Relation Age of Onset  . Hypertension Mother     Social History  Social History   Social History  . Marital status: Married    Spouse name: N/A  . Number of children: N/A  . Years of education: N/A   Occupational History  . Not on file.   Social History Main Topics  . Smoking status: Current Every Day Smoker  . Smokeless tobacco: Never Used  . Alcohol use Yes     Comment: "Over a 12 pack"/day  . Drug use: Yes    Types: Cocaine  . Sexual activity: Not on file   Other Topics Concern  . Not on file   Social History  Narrative  . No narrative on file     Review of Systems General:  No chills, fever, night sweats or weight changes.  Cardiovascular:  No chest pain, dyspnea on exertion, edema, orthopnea, palpitations, paroxysmal nocturnal dyspnea. Dermatological: No rash, lesions/masses Respiratory: No cough, dyspnea Urologic: No hematuria, dysuria Abdominal:   No nausea, vomiting, diarrhea, bright red blood per rectum, melena, or hematemesis Neurologic:  No visual changes, wkns, changes in mental status. All other systems reviewed and are otherwise negative except as noted above.  Physical Exam  Blood pressure (!) 153/91, pulse 79, temperature 98.5 F (36.9 C), temperature source Oral,  resp. rate 20, height 5\' 6"  (1.676 m), weight 153 lb 6.4 oz (69.6 kg), SpO2 99 %.  General: Pleasant, NAD Psych: Normal affect. Neuro: Alert and oriented X 3. Moves all extremities spontaneously. HEENT: Normal  Neck: Supple without bruits or JVD. Lungs:  Resp regular and unlabored, CTA. Heart: RRR no s3, s4, or murmurs. Abdomen: Soft, non-tender, non-distended, BS + x 4.  Extremities: No clubbing, cyanosis or edema. DP/PT/Radials 2+ and equal bilaterally.  Labs  Troponin [facility](Point of Care Test)  Recent Labs  11/20/16 2104  TROPIPOC 0.01    Recent Labs  11/21/16 0028 11/21/16 0748  TROPONINI <0.03 <0.03   Lab Results  Component Value Date   WBC 10.2 11/20/2016   HGB 13.5 11/20/2016   HCT 39.8 11/20/2016   MCV 90.7 11/20/2016   PLT 290 11/20/2016     Recent Labs Lab 11/20/16 1415  NA 137  K 3.9  CL 103  CO2 26  BUN 11  CREATININE 1.10  CALCIUM 9.2  PROT 7.0  BILITOT 0.7  ALKPHOS 78  ALT 19  AST 30  GLUCOSE 158*   No results found for: CHOL, HDL, LDLCALC, TRIG No results found for: DDIMER   Radiology/Studies  Dg Chest 2 View  Result Date: 11/20/2016 CLINICAL DATA:  Mid chest pain with vomiting today. The patient wishes to D toxic thigh for alcohol and crack cocaine which he use last night. History of hypertension but not currently on medication. Current smoker. EXAM: CHEST  2 VIEW COMPARISON:  PA and lateral chest x-ray of May 09, 2009 FINDINGS: The lungs are well-expanded and clear. The heart and pulmonary vascularity are normal. The mediastinum is normal in width. There is no pleural effusion. The bony thorax is unremarkable. IMPRESSION: There is no active cardiopulmonary disease. Electronically Signed   By: David  SwazilandJordan M.D.   On: 11/20/2016 14:35    ECG  NSR. Right atrial enlargement -- personally reviewed  Telemetry  NSR -- personally reviewed    ASSESSMENT AND PLAN  Principal Problem:   Chest pain Active Problems:   ETOH abuse    Cocaine abuse   Chest pain, rule out acute myocardial infarction  1. Atypical Chest Pain: pain is atypical>>sharp stabbing pain lasting seconds at a time. EKG nonischemic and serial troponins are negative. Would avoid stress testing given recent cocaine use. He needs to stop cocaine, tobacco and ETOH. He acknowledges this and wants treatment at a detox facility, which I have encouraged him to do so. Continue management of HTN. Avoid use of beta blockers given cocaine use. No further inpatient cardiac testing at this time, unless cardiologist feels otherwise. MD to follow and will provider further recommendations.    Signed, Robbie LisBrittainy Simmons, PA-C, MHS 11/21/2016, 1:16 PM [facility] Pager: [phone]     History and all data above reviewed.  Patient examined.  I agree with the  findings as above.   He reports a stabbing chest discomfort that comes on sporadically.  It seems to last for only a few minutes at a time.  There is no radiation. He thinks that it is about 6/10.  There is no N/V thought he might get SOB.  He cannot tell me how long this has been going on.  There is no objective evidence of ischemia.   The patient exam reveals COR:RRR  ,  Lungs: Clear  ,  Abd: Positive bowel sounds, no rebound no guarding, Ext No edema  .  All available labs, radiology testing, previous records reviewed. Agree with documented assessment and plan.  HEART Score is 2.  Low risk.   Pain is atypical.  OK to discharge without further in patient studies.  He can be set up to have a POET (Plain Old Exercise Treadmill) as an outpatient.  We will arrange this.     Fayrene Fearing Maxon Kresse  1:38 PM  11/21/2016

## 2016-11-21 NOTE — Progress Notes (Signed)
Per report pt refuses bedalarm

## 2016-11-21 NOTE — Progress Notes (Signed)
Call placed to [facility] to notify of telemetry monitoring d/c.   

## 2016-11-21 NOTE — Progress Notes (Addendum)
Call from [facility] for increased heart rate, pt was ambulating to restroom during that time.   Pt previously reported some mild discomfort to the sternum, primarily with repositioning or coughing. No pain at rest, tender with palpation.  Tylenol given for pain.

## 2016-11-21 NOTE — Discharge Summary (Signed)
Physician Discharge Summary  Steven Lucas ZOX:[id number] DOB: 10/12/1960 DOA: 11/20/2016  PCP: Patient, No Pcp Per  Admit date: 11/20/2016 Discharge date: 11/21/2016  Admitted From: Home Disposition:  Home  Recommendations for Outpatient Follow-up:  Patient will be called by cardiology for outpatient treadmill stress test.  Home Health: None Equipment/Devices: None  Discharge Condition: Fair CODE STATUS: Full code Diet recommendation: Low sodium    Discharge Diagnoses:  Principle problem Cocaine induced chest pain  Active Problems:   ETOH abuse   Cocaine abuse   Chest pain, rule out acute myocardial infarction   Uncontrolled hypertension  Brief narrative/history of present illness 56 year old male with hypertension (nonadherent to medication), ongoing cocaine use (smokes at least once a week, last use one day prior to admission and smoked about $ 200 worth cocaine), active alcohol use (drinks about 12 packs. Daily). He presented to the ED with request for detox. On the night prior to admission he developed sharp substernal chest pain, nonradiating and associated with heavy breathing. Pain was constant swelling became to the ED. In the ED he received 1 mg Ativan and was pain-free. EKG unremarkable and serial troponins negative. Patient placed on observation for ACS rule out.  Hospital course Chest pain, atypical Cardiology consult appreciated. Appears atypical and likely musculoskeletal versus associated with cocaine use. No further cardiac inpatient testing necessary per cardiology and will arrange outpatient treadmill stress test. Chest pain currently resolved. Patient counseled strongly on quitting smoking, cocaine use and alcohol use.  Uncontrolled hypertension Noncompliant to medication. I prescribed him his HCTZ again and counseled on medication adherence.  Polysubstance abuse Patient interested in outpatient detox of alcohol. Social work consulted. No signs of alcohol  withdrawal. Counseled strongly on suggestion.  Disposition: Home  Consults: Cardiology Procedures: None Family communication: None at bedside      Discharge Instructions   Allergies as of 11/21/2016   No Known Allergies     Medication List    TAKE these medications   hydrochlorothiazide 25 MG tablet Commonly known as:  HYDRODIURIL Take 1 tablet (25 mg total) by mouth daily.      [facility] Information    [facility] [address] [facility]   Specialty:  Cardiology Why:  Office will call for treadmill stress test. Contact information: [address], [address] [place] [id number] [phone]         No Known Allergies    Procedures/Studies: Dg Chest 2 View  Result Date: 11/20/2016 CLINICAL DATA:  Mid chest pain with vomiting today. The patient wishes to D toxic thigh for alcohol and crack cocaine which he use last night. History of hypertension but not currently on medication. Current smoker. EXAM: CHEST  2 VIEW COMPARISON:  PA and lateral chest x-ray of May 09, 2009 FINDINGS: The lungs are well-expanded and clear. The heart and pulmonary vascularity are normal. The mediastinum is normal in width. There is no pleural effusion. The bony thorax is unremarkable. IMPRESSION: There is no active cardiopulmonary disease. Electronically Signed   By: Steven  Lucas M.D.   On: 11/20/2016 14:35      Subjective: Denies further chest pain symptoms. Stable on telemetry.  Discharge Exam: Vitals:   11/21/16 0523 11/21/16 1216  BP: 139/72 (!) 153/91  Pulse: 86 79  Resp: 18 20  Temp: 97.9 F (36.6 C) 98.5 F (36.9 C)   Vitals:   11/20/16 2221 11/21/16 0158 11/21/16 0523 11/21/16 1216  BP: (!) 142/79 (!) 143/93 139/72 (!) 153/91  Pulse:  86 88 86 79  Resp: 18 18 18 20   Temp: 97.5 F (36.4 C) 97.5 F (36.4 C) 97.9 F (36.6 C) 98.5 F (36.9 C)  TempSrc: Oral Oral Oral Oral  SpO2: 99% 100% 100% 99%  Weight: 71.7 kg (158 lb)  69.6 kg  (153 lb 6.4 oz)   Height: 5\' 6"  (1.676 m)       General: Middle aged male not in distress HEENT: Moist mucosa, supple neck Chest: Clear bilaterally CVS: Normal S1 and S2, no murmurs GI: Soft, nondistended, nontender Musculoskeletal: Warm, no edema    The results of significant diagnostics from this hospitalization (including imaging, microbiology, ancillary and laboratory) are listed below for reference.     Microbiology: No results found for this or any previous visit (from the past 240 hour(s)).   Labs: BNP (last 3 results) No results for input(s): BNP in the last 8760 hours. Basic Metabolic Panel:  Recent Labs Lab 11/20/16 1415  NA 137  K 3.9  CL 103  CO2 26  GLUCOSE 158*  BUN 11  CREATININE 1.10  CALCIUM 9.2  MG 2.2   Liver Function Tests:  Recent Labs Lab 11/20/16 1415  AST 30  ALT 19  ALKPHOS 78  BILITOT 0.7  PROT 7.0  ALBUMIN 3.9   No results for input(s): LIPASE, AMYLASE in the last 168 hours. No results for input(s): AMMONIA in the last 168 hours. CBC:  Recent Labs Lab 11/20/16 1415  WBC 10.2  HGB 13.5  HCT 39.8  MCV 90.7  PLT 290   Cardiac Enzymes:  Recent Labs Lab 11/21/16 0028 11/21/16 0748  TROPONINI <0.03 <0.03   BNP: Invalid input(s): POCBNP CBG: No results for input(s): GLUCAP in the last 168 hours. D-Dimer No results for input(s): DDIMER in the last 72 hours. Hgb A1c No results for input(s): HGBA1C in the last 72 hours. Lipid Profile No results for input(s): CHOL, HDL, LDLCALC, TRIG, CHOLHDL, LDLDIRECT in the last 72 hours. Thyroid function studies No results for input(s): TSH, T4TOTAL, T3FREE, THYROIDAB in the last 72 hours.  Invalid input(s): FREET3 Anemia work up No results for input(s): VITAMINB12, FOLATE, FERRITIN, TIBC, IRON, RETICCTPCT in the last 72 hours. Urinalysis No results found for: COLORURINE, APPEARANCEUR, LABSPEC, PHURINE, GLUCOSEU, HGBUR, BILIRUBINUR, KETONESUR, PROTEINUR, UROBILINOGEN,  NITRITE, LEUKOCYTESUR Sepsis Labs Invalid input(s): PROCALCITONIN,  WBC,  LACTICIDVEN Microbiology No results found for this or any previous visit (from the past 240 hour(s)).   Time coordinating discharge: < 30 minutes  SIGNED:   Eddie North, MD  [facility] 11/21/2016, 2:27 PM Pager   If 7PM-7AM, please contact night-coverage [email or web address] Password [email or web address]

## 2016-11-21 NOTE — Progress Notes (Signed)
Pt has orders to be discharged. Discharge instructions given and pt has no additional questions at this time. Medication regimen reviewed and pt educated. Pt verbalized understanding and has no additional questions. Telemetry box removed. IV removed and site in good condition. Pt stable and waiting for transportation.  Masiel Gentzler RN 

## 2016-11-22 LAB — ECHOCARDIOGRAM COMPLETE
Height: 66 in
Weight: 2454.4 oz

## 2016-11-24 NOTE — Clinical Social Work Note (Signed)
CSW receiving calls from patient and his wife today regarding inpatient substance abuse treatment. Patient wants [facility] and requesting for CSW to fax over documentation from hospital stay for referral. CSW spoke with admissions coordinator and faxed referral.  Charlynn CourtSarah Carden Teel, CSW [phone]
